# Patient Record
Sex: Female | Born: 1950 | Race: Black or African American | Hispanic: No | Smoking: Never smoker
Health system: Southern US, Community
[De-identification: ages and names within clinical notes are randomized; demographics above are authoritative.]

## PROBLEM LIST (undated history)

## (undated) DIAGNOSIS — I77810 Thoracic aortic ectasia: Secondary | ICD-10-CM

## (undated) DIAGNOSIS — I10 Essential (primary) hypertension: Secondary | ICD-10-CM

## (undated) DIAGNOSIS — F329 Major depressive disorder, single episode, unspecified: Secondary | ICD-10-CM

## (undated) DIAGNOSIS — R51 Headache: Secondary | ICD-10-CM

## (undated) DIAGNOSIS — I519 Heart disease, unspecified: Secondary | ICD-10-CM

## (undated) DIAGNOSIS — F32A Depression, unspecified: Secondary | ICD-10-CM

## (undated) DIAGNOSIS — R413 Other amnesia: Secondary | ICD-10-CM

## (undated) HISTORY — DX: Thoracic aortic ectasia: I77.810

## (undated) HISTORY — DX: Depression, unspecified: F32.A

## (undated) HISTORY — DX: Other amnesia: R41.3

## (undated) HISTORY — DX: Major depressive disorder, single episode, unspecified: F32.9

## (undated) HISTORY — DX: Essential (primary) hypertension: I10

## (undated) HISTORY — DX: Headache: R51

## (undated) HISTORY — DX: Heart disease, unspecified: I51.9

---

## 2002-06-18 HISTORY — PX: GALLBLADDER SURGERY: SHX652

## 2012-09-03 ENCOUNTER — Other Ambulatory Visit (HOSPITAL_BASED_OUTPATIENT_CLINIC_OR_DEPARTMENT_OTHER): Payer: Self-pay | Admitting: *Deleted

## 2012-09-03 DIAGNOSIS — Z1231 Encounter for screening mammogram for malignant neoplasm of breast: Secondary | ICD-10-CM

## 2012-09-05 ENCOUNTER — Ambulatory Visit (HOSPITAL_BASED_OUTPATIENT_CLINIC_OR_DEPARTMENT_OTHER)
Admission: RE | Admit: 2012-09-05 | Discharge: 2012-09-05 | Disposition: A | Discharge status: Home / Self Care | Payer: BC Managed Care – PPO | Source: Ambulatory Visit | Attending: Diagnostic Neuroimaging | Admitting: Diagnostic Neuroimaging

## 2012-09-05 DIAGNOSIS — Z1231 Encounter for screening mammogram for malignant neoplasm of breast: Secondary | ICD-10-CM

## 2012-09-22 ENCOUNTER — Encounter: Payer: Self-pay | Admitting: Diagnostic Neuroimaging

## 2012-09-22 ENCOUNTER — Ambulatory Visit (INDEPENDENT_AMBULATORY_CARE_PROVIDER_SITE_OTHER): Payer: BC Managed Care – PPO | Admitting: Diagnostic Neuroimaging

## 2012-09-22 VITALS — BP 146/87 | HR 77 | Ht 60.0 in | Wt 206.0 lb

## 2012-09-22 DIAGNOSIS — R413 Other amnesia: Secondary | ICD-10-CM | POA: Insufficient documentation

## 2012-09-22 DIAGNOSIS — F419 Anxiety disorder, unspecified: Secondary | ICD-10-CM

## 2012-09-22 DIAGNOSIS — F411 Generalized anxiety disorder: Secondary | ICD-10-CM

## 2012-09-22 DIAGNOSIS — F329 Major depressive disorder, single episode, unspecified: Secondary | ICD-10-CM

## 2012-09-22 DIAGNOSIS — F32A Depression, unspecified: Secondary | ICD-10-CM

## 2012-09-22 NOTE — Patient Instructions (Signed)
I will check additional testing and call you with the results.

## 2012-09-22 NOTE — Progress Notes (Signed)
[facility]  PATIENT: Kara Thornton DOB: 22-Oct-1950  REFERRING CLINICIAN: [facility] HISTORY FROM: patient and daughter REASON FOR VISIT: new consult   HISTORICAL  CHIEF COMPLAINT:  Chief Complaint  Patient presents with  . Memory Loss  . Pain    head, L leg, both arms    HISTORY OF PRESENT ILLNESS:   62 year old female with hypertension, here for evaluation of memory loss.  Since August 2013, patient has had progressive short-term memory problems. 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 In October 2013, per schedule was changed. Patient had difficult time compensating for her change in routine. She was needing more assistance from other teachers. Around the same time she was having more stress overall. She was having 2 right more notes, having more problems with short-term memory loss, forgetting where she placed objects. Over the past 3 weeks, she and her daughter decided we better for patient to live with daughter here in [place]. Patient is also been getting other medical evaluation and testing.  Patient has a diffuse constellation of symptoms including migraine headaches, neck pain, pain in her elbows, pain in her lower leg. She has some numbness and tingling as well.  Patient also having intermittent tearing of her eyes. Patient not able to comment witnesses crying related to depression and emotions or uncontrolled lacrimation from other cause.  REVIEW OF SYSTEMS: Full 14 system review of systems performed and notable only for fatigue, chest pain, palpitations murmur rash itching shortness of breath, flushing joint pain skin sensitivity memory loss confusion headache sleepiness snoring restless legs anxiety decreased energy change in appetite.  ALLERGIES: No Known Allergies  HOME MEDICATIONS: No outpatient prescriptions prior to visit.   No facility-administered medications prior to visit.     PAST MEDICAL HISTORY: Past Medical History  Diagnosis Date  . Hypertension   . Headache   . Heart disease, unspecified     PAST SURGICAL HISTORY: Past Surgical History  Procedure Laterality Date  . Gallbladder surgery  2004    FAMILY HISTORY: Family History  Problem Relation Age of Onset  . Alzheimer's disease Mother   . Other Father     infection after heart surgery  . Other      blood clots  . Aneurysm    . Other      valve problems    SOCIAL HISTORY:  History   Social History  . Marital Status: Legally Separated    Spouse Name: N/A    Number of Children: 3  . 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 Smoking status: Never Smoker   . Smokeless tobacco: Not on file  . Alcohol Use: No  . Drug Use: No  . Sexually Active: Not on file   Other Topics Concern  . Not on file   Social History Narrative   Pt lives at home alone.    Caffeine Use-  Quit drinking 3-4 cups daily on 08/25/12.     PHYSICAL EXAM  Filed Vitals:   09/22/12 1159  BP: 146/87  Pulse: 77  Height: 5' (1.524 m)  Weight: 206 lb (93.441 kg)   Body mass index is 40.23 kg/(m^2).  GENERAL EXAM: Patient is in no distress  CARDIOVASCULAR: Regular rate and rhythm, no murmurs, no carotid bruits  NEUROLOGIC: MENTAL STATUS: awake, alert, language fluent, comprehension intact, naming intact; MMSE  26/30. GDS 6. NO FRONTAL RELEASE SIGNS. TEARFUL. CRANIAL NERVE: no papilledema on fundoscopic exam, pupils equal and reactive to light, visual fields full to confrontation, extraocular muscles intact, no nystagmus, facial sensation and strength symmetric, uvula midline, shoulder shrug symmetric, tongue midline. MOTOR: normal bulk and tone, full strength in the BUE, BLE SENSORY: normal and symmetric to light touch, pinprick, temperature, vibration COORDINATION: finger-nose-finger, fine finger movements normal REFLEXES:  deep tendon reflexes present and symmetric GAIT/STATION: narrow based gait; UNABLE TO TANDEM. Romberg is negative   DIAGNOSTIC DATA (LABS, IMAGING, TESTING) - I reviewed patient records, labs, notes, testing and imaging myself where available.  No results found for this basename: WBC, HGB, HCT, MCV, PLT   No results found for this basename: na, k, cl, co2, glucose, bun, creatinine, calcium, prot, albumin, ast, alt, alkphos, bilitot, gfrnonaa, gfraa   No results found for this basename: CHOL, HDL, LDLCALC, LDLDIRECT, TRIG, CHOLHDL   No results found for this basename: HGBA1C   No results found for this basename: VITAMINB12   No results found for this basename: TSH     ASSESSMENT AND PLAN  62 y.o. year old female  has a past medical history of Hypertension; Headache; and Heart disease, unspecified. here with progressive short-term memory loss. Confounding factors include concomitant depression, anxiety symptoms, stress related to change in her work situation, diffuse pain in her neck, elbow and left leg.  Differential diagnosis includes mild dementia, metabolic, stress, conversion reaction.  I will check MRI of the brain, lab testing and also have more detailed neuropsychological evaluation. It is probably not a good idea for patient to work at this time until her symptoms improve.   Orders Placed This Encounter  Procedures  . MR Brain Wo Contrast  . TSH  . Vitamin B12  . Ambulatory referral to Neuropsychology    Suanne Marker, MD 09/22/2012, 12:29 PM Certified in Neurology, Neurophysiology and Neuroimaging  [facility] [address], [address] [place], [place] [address] [phone]

## 2012-10-03 ENCOUNTER — Telehealth: Payer: Self-pay

## 2012-10-03 NOTE — Telephone Encounter (Signed)
Pt called in error. Was trying to return call to [facility] to confirm MRI appt.

## 2012-10-17 ENCOUNTER — Telehealth: Payer: Self-pay

## 2012-10-17 NOTE — Telephone Encounter (Signed)
Agree. Please write letter. -VRP

## 2012-10-17 NOTE — Telephone Encounter (Signed)
Spoke to daughter. Says pt's symptoms have increased. More confusion and pain. Was told sleep apnea results were high. 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 Neuropsych. testing and MRI are both scheduled for 10/23/12. Pt has been approved for disability but needs original work letter written by our office extended to end of school year (12/15/12).

## 2012-10-20 ENCOUNTER — Telehealth: Payer: Self-pay | Admitting: Nurse Practitioner

## 2012-10-20 NOTE — Telephone Encounter (Signed)
I called and left a message for the patient that Dr. Marjory Lies has to sign the letter and once sign, she will receive a call.

## 2012-10-23 ENCOUNTER — Ambulatory Visit (INDEPENDENT_AMBULATORY_CARE_PROVIDER_SITE_OTHER): Payer: BC Managed Care – PPO

## 2012-10-23 DIAGNOSIS — R413 Other amnesia: Secondary | ICD-10-CM

## 2012-10-29 DIAGNOSIS — Z0289 Encounter for other administrative examinations: Secondary | ICD-10-CM

## 2012-10-30 DIAGNOSIS — R413 Other amnesia: Secondary | ICD-10-CM

## 2012-11-17 ENCOUNTER — Telehealth: Payer: Self-pay | Admitting: Diagnostic Neuroimaging

## 2012-11-17 NOTE — Telephone Encounter (Signed)
Patient is calling to inquire about her disability paper work.  This was due on May 24 of this year.  Please give her a call back at:  [phone].

## 2012-11-19 ENCOUNTER — Telehealth: Payer: Self-pay | Admitting: Diagnostic Neuroimaging

## 2012-11-20 NOTE — Telephone Encounter (Signed)
I called and spoke with patient's daughter and she stated her mom will be seeing her pcp on 12-03-12 and she will see what her pcp prescribe for her headache and will call back to see if Dr. Marjory Lies agrees with the medication.

## 2012-11-21 ENCOUNTER — Telehealth: Payer: Self-pay | Admitting: Diagnostic Neuroimaging

## 2012-11-21 NOTE — Telephone Encounter (Signed)
I called and spoke with the patient that she dropped off her paper on May 4 and there's a two week turn around for forms and they're ready she will receive  a call.

## 2012-11-21 NOTE — Telephone Encounter (Signed)
Patient is calling to see if her disability paper work has been completed.  She would like to have a call back asap.

## 2012-11-27 DIAGNOSIS — Z0289 Encounter for other administrative examinations: Secondary | ICD-10-CM

## 2012-11-28 ENCOUNTER — Telehealth: Payer: Self-pay | Admitting: Diagnostic Neuroimaging

## 2012-12-02 NOTE — Telephone Encounter (Signed)
I called patient's daughter back and left a voice message that I was returning her call.  Also, I am unsure what she needs regarding a doctor's note. Please call back.

## 2013-01-12 DIAGNOSIS — Z0289 Encounter for other administrative examinations: Secondary | ICD-10-CM

## 2013-01-23 ENCOUNTER — Telehealth: Payer: Self-pay

## 2013-01-23 NOTE — Telephone Encounter (Signed)
I called and left a VM for patient that we have to see her before we can complete medical forms. Please call and schedule an appointment with Heide Guile, NP who works with Dr. Marjory Lies so we can see you quite readily.

## 2013-02-25 ENCOUNTER — Telehealth: Payer: Self-pay | Admitting: Nurse Practitioner

## 2013-02-25 NOTE — Telephone Encounter (Signed)
Call pt about her appt. And the pt acknowledge that she was coming. °

## 2013-02-26 ENCOUNTER — Ambulatory Visit (INDEPENDENT_AMBULATORY_CARE_PROVIDER_SITE_OTHER): Payer: BC Managed Care – PPO | Admitting: Nurse Practitioner

## 2013-02-26 ENCOUNTER — Encounter: Payer: Self-pay | Admitting: Nurse Practitioner

## 2013-02-26 ENCOUNTER — Encounter: Payer: Self-pay | Admitting: *Deleted

## 2013-02-26 ENCOUNTER — Telehealth: Payer: Self-pay | Admitting: Diagnostic Neuroimaging

## 2013-02-26 VITALS — BP 139/71 | HR 60 | Temp 98.8°F | Ht 60.0 in | Wt 221.0 lb

## 2013-02-26 DIAGNOSIS — F3289 Other specified depressive episodes: Secondary | ICD-10-CM

## 2013-02-26 DIAGNOSIS — F419 Anxiety disorder, unspecified: Secondary | ICD-10-CM

## 2013-02-26 DIAGNOSIS — F411 Generalized anxiety disorder: Secondary | ICD-10-CM

## 2013-02-26 DIAGNOSIS — R202 Paresthesia of skin: Secondary | ICD-10-CM

## 2013-02-26 DIAGNOSIS — F32A Depression, unspecified: Secondary | ICD-10-CM

## 2013-02-26 DIAGNOSIS — R413 Other amnesia: Secondary | ICD-10-CM

## 2013-02-26 DIAGNOSIS — F329 Major depressive disorder, single episode, unspecified: Secondary | ICD-10-CM

## 2013-02-26 DIAGNOSIS — R209 Unspecified disturbances of skin sensation: Secondary | ICD-10-CM

## 2013-02-26 NOTE — Progress Notes (Signed)
I reviewed note and agree with plan.   Suanne Marker, MD 02/26/2013, 1:23 PM Certified in Neurology, Neurophysiology and Neuroimaging  [facility] [address] [place], [place] [address] [phone]

## 2013-02-26 NOTE — Patient Instructions (Addendum)
We will re-check Vitamin B12 levels today and let you know early in next week of the results.  Your memory testing is considered normal for age, and teting is better today than last visit.  Anxiety and depression are likely contributing to the memory problems.  Continue the medication that your family dr. Has prescribed and get referral to Psychiatry if needed.  Follow up here in 3 months.

## 2013-02-26 NOTE — Progress Notes (Signed)
[facility]  PATIENT: Kara Thornton DOB: 1951-05-14   REASON FOR VISIT: follow up HISTORY FROM: Granddaugter, patient  HISTORY OF PRESENT ILLNESS: 09/22/12 PRIOR HPI (VRP):  62 year old female with hypertension, here for evaluation of memory loss.  Since August 2013, patient has had progressive short-term memory problems. 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 Patient had difficult time compensating for her change in routine. She was needing more assistance from other teachers. Around the same time she was having more stress overall. She was having 2 right more notes, having more problems with short-term memory loss, forgetting where she placed objects. Over the past 3 weeks, she and her daughter decided it would be better for patient to live with daughter here in [place]. Patient is also been getting other medical evaluation and testing.   Patient has a diffuse constellation of symptoms including migraine headaches, neck pain, pain in her elbows, pain in her lower leg. She has some numbness and tingling as well.  Patient also having intermittent tearing of her eyes. Patient not able to comment witnesses crying related to depression and emotions or uncontrolled lacrimation from other cause.   UPDATE 02/26/13 (LL): Patient returns for followup after initial visit on 09/22/2012.  Since that visit testing showed borderline low vitamin B12 level, she was started on by mouth supplementation; MRI brain showing nonspecific white matter changes likely small vessel ischemic disease, normal TSH, and neuropsychological testing which indicated mild impairments of attention, cognitive flexibility and organizational skills. Memory retention, naming to confrontation, verbal fluency, visual spatial organization, constructional praxis and conceptual reasoning skills were within normal limits. On the Beck  depression inventory and anxiety inventories her scores both fell in the severe range for depression and anxiety. She was started by family medicine on an SSRI and then switched to Prozac 20 mg daily.  Granddaughter notes that her pitch take his symptoms have improved, but cognitive function has not.  She continues to have many somatic pain complaints.  REVIEW OF SYSTEMS: Full 14 system review of systems performed and notable only for fatigue, chest pain, palpitations, cough, snoring, feeling hot, flushing, joint pain, memory loss confusion headache, numbness, weakness, sleepiness snoring restless legs, depression, anxiety, decreased energy, change in appetite.   ALLERGIES: No Known Allergies  HOME MEDICATIONS: Outpatient Prescriptions Prior to Visit  Medication Sig Dispense Refill  . lisinopril-hydrochlorothiazide (PRINZIDE,ZESTORETIC) 20-25 MG per tablet Take 1 tablet by mouth daily.       Medications  . verapamil (COVERA HS) 240 MG (CO) 24 hr tablet    Sig: Take 240 mg by mouth at bedtime.  . metoprolol succinate (TOPROL-XL) 50 MG 24 hr tablet    Sig: Take 50 mg by mouth daily. Take with or immediately following a meal.  . vitamin B-12 (CYANOCOBALAMIN) 1000 MCG tablet    Sig: Take 1,000 mcg by mouth daily.  . Cholecalciferol (D3-1000) 1000 UNITS capsule    Sig: Take 1,000 Units by mouth daily.  Marland Kitchen FLUoxetine (PROZAC) 20 MG capsule    Sig: Take 20 mg by mouth daily.  . ranitidine (ZANTAC) 150 MG tablet    Sig: Take 150 mg by mouth 2 (two) times daily.  . celecoxib (CELEBREX) 200 MG capsule    Sig: Take 200 mg by mouth as needed for pain.    PAST MEDICAL HISTORY: Past Medical History  Diagnosis Date  . Hypertension   . Headache(784.0)   .  Heart disease, unspecified     PAST SURGICAL HISTORY: Past Surgical History  Procedure Laterality Date  . Gallbladder surgery  2004    FAMILY HISTORY: Family History  Problem Relation Age of Onset  . Alzheimer's disease Mother   .  Other Father     infection after heart surgery  . Other      blood clots  . Aneurysm    . Other      valve problems    SOCIAL HISTORY: History   Social History  . Marital Status: Legally Separated    Spouse Name: N/A    Number of Children: 3  . 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 Smoking status: Never Smoker   . Smokeless tobacco: Not on file  . Alcohol Use: No  . Drug Use: No  . Sexual Activity: Not on file   Other Topics Concern  . Not on file   Social History Narrative   Pt lives at home alone.    Caffeine Use-  Quit drinking 3-4 cups daily on 08/25/12.     PHYSICAL EXAM  Filed Vitals:   02/26/13 0913  BP: 139/71  Pulse: 60  Temp: 98.8 F (37.1 C)  TempSrc: Oral  Height: 5' (1.524 m)  Weight: 221 lb (100.245 kg)   Body mass index is 43.16 kg/(m^2).  Generalized: Well developed, in no acute distress, obese female Head: normocephalic and atraumatic. Oropharynx benign  Neck: Supple, no carotid bruits  Cardiac: Regular rate rhythm, no murmur  Musculoskeletal: No deformity   NEUROLOGIC:  MENTAL STATUS: awake, alert, language fluent, comprehension intact, naming intact; MMSE 29/30 WITH DEFICIT IN RECALL, 2/3, AFT 10, CLOCK DRAWING 2/4, GDS 9 SUGGESTS DEPRESSION. (Last visit on 09/22/12: MMSE 26/30. GDS 6). NO FRONTAL RELEASE SIGNS. TEARFUL, ANXIOUS.  CRANIAL NERVE: pupils equal and reactive to light, visual fields full to confrontation, extraocular muscles intact, no nystagmus, facial sensation and strength symmetric, uvula midline, shoulder shrug symmetric, tongue midline.  MOTOR: normal bulk and tone, full strength in the BUE, BLE  SENSORY: normal and symmetric to light touch, pinprick  COORDINATION: finger-nose-finger, fine finger movements normal  REFLEXES: deep tendon reflexes present and symmetric  GAIT/STATION: narrow based gait; UNABLE TO TANDEM. Romberg is  negative  DIAGNOSTIC DATA (LABS, IMAGING, TESTING) - I reviewed patient records, labs, notes, testing and imaging myself where available.  Lab Results  Component Value Date   VITAMINB12 224 09/23/2012   Lab Results  Component Value Date   TSH 1.620 09/23/2012   10/23/12 MRI of the brain without contrast  Equivocal MRI brain (without) demonstrating scattered non-specific foci of gliosis in the subcortical and periventricular white matter, likely mild chronic small vessel ischemic disease.  ASSESSMENT AND PLAN 62 y.o. year old female has a past medical history of Hypertension; Headache; and Heart disease, here with progressive short-term memory loss. Confounding factors include concomitant depression, anxiety symptoms, stress related to change in her work situation, diffuse pain in her neck, elbow and bilateral hips.  Patient was found to have small vessel disease on MRI brain, which could represent a mild vascular dementia.  Consistent high scores on depression and anxiety inventories along with many somatic complaints point to under-treated psychological etiology.    PLAN: 1. Vitamin B12 level was borderline low and oral supplementation was started after last visit, recheck level today, may need to change to injections. 2. Continue follow  up with PCP for depression and anxiety; referral to Psychiatry is advised. 3. It is not advised for patient to work at this time until her symptoms improve, letter given for 90 days, then re-evaluate at next follow up visit.  Orders Placed This Encounter  Procedures  . Vitamin B12   LYNN E. LAM, MSN, NP-C 02/26/2013, 11:33 AM [facility] [address] [place], [place] [address] [phone]

## 2013-02-26 NOTE — Telephone Encounter (Signed)
I called patient's daughter back and left her a VM that I got her message that her mom had an office visit. I will return her forms back to the stack to be worked on. Anticipate two weeks turn around.

## 2013-02-27 ENCOUNTER — Ambulatory Visit: Payer: BC Managed Care – PPO | Admitting: Nurse Practitioner

## 2013-02-27 ENCOUNTER — Telehealth: Payer: Self-pay | Admitting: Diagnostic Neuroimaging

## 2013-02-27 LAB — VITAMIN B12: Vitamin B-12: 467 pg/mL (ref 211–946)

## 2013-02-27 NOTE — Telephone Encounter (Signed)
I called and spoke to daughter, Lawson Fiscal, and relayed the letter contents.  Change from the original, to make the time back for re eval on 04-21-13 0830 with Dr. Marjory Lies.  She relayed this was ok.  Fax to [phone].   DONE.

## 2013-03-02 ENCOUNTER — Telehealth: Payer: Self-pay | Admitting: *Deleted

## 2013-03-02 NOTE — Telephone Encounter (Signed)
Please call daughter Jacki Cones. She wants the results for her moms B-12 labs. She can be reached at [phone]

## 2013-03-03 NOTE — Progress Notes (Signed)
Quick Note:  I called and relayed message below re: B12 level. Both she and Lawson Fiscal, her daughter verbalized understanding. ______

## 2013-03-13 ENCOUNTER — Telehealth: Payer: Self-pay

## 2013-03-16 ENCOUNTER — Telehealth: Payer: Self-pay

## 2013-03-16 NOTE — Telephone Encounter (Signed)
Forms with MD for signature

## 2013-03-24 ENCOUNTER — Telehealth: Payer: Self-pay

## 2013-03-24 NOTE — Telephone Encounter (Signed)
Called patient to update on status of disability forms. Phone numbers have been disconnected.

## 2013-03-24 NOTE — Telephone Encounter (Signed)
I attempted to call patient to update on status of disability forms. Both phone numbers have been disconnected.

## 2013-03-30 ENCOUNTER — Telehealth: Payer: Self-pay | Admitting: Diagnostic Neuroimaging

## 2013-03-30 NOTE — Telephone Encounter (Signed)
I called patient's daughter and left a VM that she will be able to come pick up forms after noon on March 31 2013.

## 2013-03-31 ENCOUNTER — Telehealth: Payer: Self-pay | Admitting: Diagnostic Neuroimaging

## 2013-04-20 ENCOUNTER — Ambulatory Visit: Payer: BC Managed Care – PPO | Admitting: Diagnostic Neuroimaging

## 2013-04-21 ENCOUNTER — Encounter: Payer: Self-pay | Admitting: Diagnostic Neuroimaging

## 2013-04-21 ENCOUNTER — Ambulatory Visit (INDEPENDENT_AMBULATORY_CARE_PROVIDER_SITE_OTHER): Payer: BC Managed Care – PPO | Admitting: Diagnostic Neuroimaging

## 2013-04-21 ENCOUNTER — Ambulatory Visit: Payer: BC Managed Care – PPO | Admitting: Diagnostic Neuroimaging

## 2013-04-21 VITALS — BP 131/73 | HR 54 | Ht 60.0 in | Wt 216.0 lb

## 2013-04-21 DIAGNOSIS — H5500 Unspecified nystagmus: Secondary | ICD-10-CM

## 2013-04-21 DIAGNOSIS — F329 Major depressive disorder, single episode, unspecified: Secondary | ICD-10-CM

## 2013-04-21 DIAGNOSIS — R413 Other amnesia: Secondary | ICD-10-CM

## 2013-04-21 DIAGNOSIS — F32A Depression, unspecified: Secondary | ICD-10-CM

## 2013-04-21 DIAGNOSIS — M25551 Pain in right hip: Secondary | ICD-10-CM

## 2013-04-21 DIAGNOSIS — M25559 Pain in unspecified hip: Secondary | ICD-10-CM

## 2013-04-21 MED ORDER — DONEPEZIL HCL 5 MG PO TABS: 5.0000 mg | ORAL_TABLET | Freq: Every day | ORAL | Status: DC

## 2013-04-21 MED ORDER — DONEPEZIL HCL 10 MG PO TABS: 10.0000 mg | ORAL_TABLET | Freq: Every day | ORAL | Status: DC

## 2013-04-21 NOTE — Progress Notes (Signed)
[facility]  PATIENT: Kara Thornton DOB: May 24, 1951   REASON FOR VISIT: follow up HISTORY FROM: Granddaughter, patient  HISTORY OF PRESENT ILLNESS:  UPDATE 04/21/13: Since last visit, depression better, but memory still poor. Patient having trouble with recent conversations, tasks, getting confused easily. Also with diffuse joint pain, mainly in right hip, s/p cortisone injection.  UPDATE 02/26/13 (LL): Patient returns for followup after initial visit on 09/22/2012.  Since that visit testing showed borderline low vitamin B12 level, she was started on by mouth supplementation; MRI brain showing nonspecific white matter changes likely small vessel ischemic disease, normal TSH, and neuropsychological testing which indicated mild impairments of attention, cognitive flexibility and organizational skills. Memory retention, naming to confrontation, verbal fluency, visual spatial organization, constructional praxis and conceptual reasoning skills were within normal limits. On the Beck depression inventory and anxiety inventories her scores both fell in the severe range for depression and anxiety. She was started by family medicine on an SSRI and then switched to Prozac 20 mg daily.  Granddaughter notes that her pitch take his symptoms have improved, but cognitive function has not.  She continues to have many somatic pain complaints.  PRIOR HPI (09/22/12, VRP):  62 year old female with hypertension, here for evaluation of memory loss.  Since August 2013, patient has had progressive short-term memory problems. 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 Patient had difficult time compensating for her change in routine. She was needing more assistance from other teachers. Around the same time she was having more stress overall. She was having 2 right more notes, having more problems with short-term memory loss,  forgetting where she placed objects. Over the past 3 weeks, she and her daughter decided it would be better for patient to live with daughter here in [place]. Patient is also been getting other medical evaluation and testing.   Patient has a diffuse constellation of symptoms including migraine headaches, neck pain, pain in her elbows, pain in her lower leg. She has some numbness and tingling as well. Patient also having intermittent tearing of her eyes. Patient not able to comment whether crying related to depression and emotions or uncontrolled lacrimation from other cause.     REVIEW OF SYSTEMS: Full 14 system review of systems performed and notable only for fatigue chest pain palpitation murmur hearing loss eye pain shortness of breath cough snoring feeling hot increased thirst flushing joint pain joint swelling runny nose depression anxiety too much sleep decreased energy to change in appetite dizziness numbness headache confusion sleepiness memory loss restless legs.   ALLERGIES: No Known Allergies  HOME MEDICATIONS: Outpatient Prescriptions Prior to Visit  Medication Sig Dispense Refill  . celecoxib (CELEBREX) 200 MG capsule Take 200 mg by mouth as needed for pain.      . Cholecalciferol (D3-1000) 1000 UNITS capsule Take 1,000 Units by mouth daily.      Marland Kitchen lisinopril-hydrochlorothiazide (PRINZIDE,ZESTORETIC) 20-25 MG per tablet Take 1 tablet by mouth daily.      . metoprolol succinate (TOPROL-XL) 50 MG 24 hr tablet Take 50 mg by mouth daily. Take with or immediately following a meal.      . ranitidine (ZANTAC) 150 MG tablet Take 150 mg by mouth 2 (two) times daily.      . verapamil (COVERA HS) 240 MG (CO) 24 hr tablet Take 240 mg by mouth at bedtime.      . vitamin B-12 (CYANOCOBALAMIN) 1000  MCG tablet Take 1,000 mcg by mouth daily.      Marland Kitchen FLUoxetine (PROZAC) 20 MG capsule Take 20 mg by mouth daily.       No facility-administered medications prior to visit.     PAST MEDICAL  HISTORY: Past Medical History  Diagnosis Date  . Hypertension   . Headache(784.0)   . Heart disease, unspecified     PAST SURGICAL HISTORY: Past Surgical History  Procedure Laterality Date  . Gallbladder surgery  2004    FAMILY HISTORY: Family History  Problem Relation Age of Onset  . Alzheimer's disease Mother   . Other Father     infection after heart surgery  . Other      blood clots  . Aneurysm    . Other      valve problems    SOCIAL HISTORY: History   Social History  . Marital Status: Legally Separated    Spouse Name: N/A    Number of Children: 3  . Years of Education: MA   Occupational History  . Teacher     [place]  .     Social History Main Topics  . Smoking status: Never Smoker   . Smokeless tobacco: Never Used  . Alcohol Use: No  . Drug Use: No  . Sexual Activity: Not on file   Other Topics Concern  . Not on file   Social History Narrative   Pt lives at home alone.    Caffeine Use-  Quit drinking 3-4 cups daily on 08/25/12.     PHYSICAL EXAM  Filed Vitals:   04/21/13 0829  BP: 131/73  Pulse: 54  Height: 5' (1.524 m)  Weight: 216 lb (97.977 kg)   Body mass index is 42.18 kg/(m^2).  GENERAL EXAM: Patient is in no distress  CARDIOVASCULAR: Regular rate and rhythm, no murmurs, no carotid bruits  NEUROLOGIC: MENTAL STATUS: awake, alert, language fluent, comprehension intact, naming intact; MMSE 23/30. NO FRONTAL RELEASE SIGNS. AFT 11. GDS 7. SLOW RESPONSE. CRANIAL NERVE: no papilledema on fundoscopic exam, pupils equal and reactive to light, visual fields full to confrontation, extraocular muscles NOTABLE FOR EXOTROPIA, WITH SUSTAINED NYSTAGMUS IN ALL DIRECTIONS (WORSE IN LEFT GAZE), facial sensation and strength symmetric, uvula midline, shoulder shrug symmetric, tongue midline. MOTOR: normal bulk and tone, full strength in the BUE, BLE SENSORY: normal and symmetric to light touch, pinprick, temperature,  vibration COORDINATION: finger-nose-finger, fine finger movements, heel-shin normal REFLEXES: deep tendon reflexes present and symmetric GAIT/STATION: narrow based gait; ANTALGIC, SLOW, UNSTEADY. CANNOT TANDEM.    DIAGNOSTIC DATA (LABS, IMAGING, TESTING) - I reviewed patient records, labs, notes, testing and imaging myself where available.  Lab Results  Component Value Date   VITAMINB12 467 02/26/2013   Lab Results  Component Value Date   TSH 1.620 09/23/2012   10/23/12  MRI brain (without) - scattered non-specific foci of gliosis in the subcortical and periventricular white matter, likely mild chronic small vessel ischemic disease.  ASSESSMENT AND PLAN 62 y.o. female with Hypertension; Headache; and Heart disease, here with progressive short-term memory loss (MMSE 26 --> 29 --> 23), depression and chronic pain.  Ddx: pseudo-dementia of depression, mild dementia, pain associated cognitive decline  PLAN: 1. MRI brain (new onset nystagmus) 2. Start donepezil 3. PT and NSAIDS for pain   Orders Placed This Encounter  Procedures  . MR Brain W Wo Contrast    Suanne Marker, MD 04/21/2013, 9:42 AM Certified in Neurology, Neurophysiology and Neuroimaging  [facility] [phone]  [address], [place], [place] [address] [phone]

## 2013-04-21 NOTE — Patient Instructions (Signed)
I will check MRI brain.  I will start donepezil 5mg  at bedtime. After 1 month, increase to 10mg  at bedtime.  Try physical therapy and water therapy for pain.

## 2013-05-20 ENCOUNTER — Encounter: Payer: Self-pay | Admitting: Nurse Practitioner

## 2013-05-21 DIAGNOSIS — Z0289 Encounter for other administrative examinations: Secondary | ICD-10-CM

## 2013-06-03 ENCOUNTER — Ambulatory Visit (INDEPENDENT_AMBULATORY_CARE_PROVIDER_SITE_OTHER): Payer: BC Managed Care – PPO

## 2013-06-03 DIAGNOSIS — R413 Other amnesia: Secondary | ICD-10-CM

## 2013-06-03 DIAGNOSIS — H5500 Unspecified nystagmus: Secondary | ICD-10-CM

## 2013-06-03 MED ORDER — GADOPENTETATE DIMEGLUMINE 469.01 MG/ML IV SOLN: 20.0000 mL | Freq: Once | INTRAVENOUS | Status: AC | PRN

## 2013-08-19 ENCOUNTER — Ambulatory Visit (INDEPENDENT_AMBULATORY_CARE_PROVIDER_SITE_OTHER): Payer: BC Managed Care – PPO | Admitting: Nurse Practitioner

## 2013-08-19 ENCOUNTER — Encounter: Payer: Self-pay | Admitting: Nurse Practitioner

## 2013-08-19 VITALS — BP 127/76 | HR 75 | Wt 226.0 lb

## 2013-08-19 DIAGNOSIS — H5500 Unspecified nystagmus: Secondary | ICD-10-CM

## 2013-08-19 DIAGNOSIS — F3289 Other specified depressive episodes: Secondary | ICD-10-CM

## 2013-08-19 DIAGNOSIS — F32A Depression, unspecified: Secondary | ICD-10-CM

## 2013-08-19 DIAGNOSIS — F329 Major depressive disorder, single episode, unspecified: Secondary | ICD-10-CM

## 2013-08-19 DIAGNOSIS — R413 Other amnesia: Secondary | ICD-10-CM

## 2013-08-19 MED ORDER — MEMANTINE HCL ER 28 MG PO CP24: 1.0000 | ORAL_CAPSULE | Freq: Every morning | ORAL | Status: DC

## 2013-08-19 NOTE — Progress Notes (Signed)
PATIENT: Kara Thornton DOB: 03/02/1951   REASON FOR VISIT: follow up for memory loss HISTORY FROM: patient  HISTORY OF PRESENT ILLNESS: UPDATE 08/19/13 (LL):  Since last visit, has been stable, seeing Psychiatrist and depression seems better. Still easily confused, short term memory very poor.  Pain is much improved since cortisone shot.  Diffuse pain improved.  Repeat MRI brain W/Wo unchanged from previous.  Functional Assessment:  Activities of Daily Living (ADLs):   She is independent in the following: ambulation, bathing and hygiene, feeding, continence, grooming, toileting and dressing Requires assistance with the following: none Instrumental Activities of Daily Living (IADLs):   Patient is independent in the following: operating telephone, shops for herself, housekeeping, laundry, drives own car. Requires assistance with the following: Medication administration, finances, meal preparation  UPDATE 04/21/13(VP): Since last visit, depression better, but memory still poor. Patient having trouble with recent conversations, tasks, getting confused easily. Also with diffuse joint pain, mainly in right hip, s/p cortisone injection.  UPDATE 02/26/13 (LL): Patient returns for followup after initial visit on 09/22/2012. Since that visit testing showed borderline low vitamin B12 level, she was started on by mouth supplementation; MRI brain showing nonspecific white matter changes likely small vessel ischemic disease, normal TSH, and neuropsychological testing which indicated mild impairments of attention, cognitive flexibility and organizational skills. Memory retention, naming to confrontation, verbal fluency, visual spatial organization, constructional praxis and conceptual reasoning skills were within normal limits. On the Beck depression inventory and anxiety inventories her scores both fell in the severe range for depression and anxiety. She was started by family medicine on an SSRI and then  switched to Prozac 20 mg daily. Granddaughter notes that her pitch take his symptoms have improved, but cognitive function has not. She continues to have many somatic pain complaints.  PRIOR HPI (09/22/12, VRP): 63 year old female with hypertension, here for evaluation of memory loss.  Since August 2013, patient has had progressive short-term memory problems. 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 Patient had difficult time compensating for her change in routine. She was needing more assistance from other teachers. Around the same time she was having more stress overall. She was having 2 right more notes, having more problems with short-term memory loss, forgetting where she placed objects. Over the past 3 weeks, she and her daughter decided it would be better for patient to live with daughter here in [place]. Patient is also been getting other medical evaluation and testing.  Patient has a diffuse constellation of symptoms including migraine headaches, neck pain, pain in her elbows, pain in her lower leg. She has some numbness and tingling as well. Patient also having intermittent tearing of her eyes. Patient not able to comment whether crying related to depression and emotions or uncontrolled lacrimation from other cause.   REVIEW OF SYSTEMS: Full 14 system review of systems performed and notable only for fatigue hearing loss eye pain shortness of breath cough snoring feeling hot increased thirst flushing joint pain joint swelling runny nose, drooling,  depression anxiety too much sleep decreased energy, agitation, dizziness numbness headache confusion sleepiness memory loss restless legs.   ALLERGIES: No Known Allergies  HOME MEDICATIONS: Outpatient Prescriptions Prior to Visit  Medication Sig Dispense Refill  . celecoxib (CELEBREX) 200 MG capsule Take 200 mg by mouth as needed for pain.      . Cholecalciferol  (D3-1000) 1000 UNITS capsule Take 1,000 Units by  mouth daily.      Marland Kitchen. donepezil (ARICEPT) 10 MG tablet Take 1 tablet (10 mg total) by mouth at bedtime.  30 tablet  12  . metoprolol succinate (TOPROL-XL) 50 MG 24 hr tablet Take 50 mg by mouth daily. Take with or immediately following a meal.      . ranitidine (ZANTAC) 150 MG tablet Take 150 mg by mouth 2 (two) times daily.      . verapamil (COVERA HS) 240 MG (CO) 24 hr tablet Take 240 mg by mouth at bedtime.      . vitamin B-12 (CYANOCOBALAMIN) 1000 MCG tablet Take 1,000 mcg by mouth daily.      Marland Kitchen. donepezil (ARICEPT) 5 MG tablet Take 1 tablet (5 mg total) by mouth at bedtime.  30 tablet  0  . Venlafaxine HCl 37.5 MG TB24 Take 1 tablet by mouth daily.      Marland Kitchen. lisinopril-hydrochlorothiazide (PRINZIDE,ZESTORETIC) 20-25 MG per tablet Take 1 tablet by mouth daily.      . medroxyPROGESTERone (PROVERA) 2.5 MG tablet Take 2.5 mg by mouth daily.       No facility-administered medications prior to visit.   PHYSICAL EXAM  Filed Vitals:   08/19/13 0846  BP: 127/76  Pulse: 75  Weight: 226 lb (102.513 kg)   Body mass index is 44.14 kg/(m^2).  GENERAL EXAM:  Patient is in no distress  CARDIOVASCULAR:  Regular rate and rhythm, no murmurs, no carotid bruits   Neurological examination  MENTAL STATUS: awake, alert, language fluent, comprehension intact, naming intact; MMSE 2230. NO FRONTAL RELEASE SIGNS. AFT 10 CLOCK DRAWING 4/4. GDS 8. SLOW RESPONSE.  CRANIAL NERVE: no papilledema on fundoscopic exam, pupils equal and reactive to light, visual fields full to confrontation, extraocular muscles NOTABLE FOR EXOTROPIA, WITH SUSTAINED NYSTAGMUS IN ALL DIRECTIONS (WORSE IN LEFT GAZE), facial sensation and strength symmetric, uvula midline, shoulder shrug symmetric, tongue midline.  MOTOR: normal bulk and tone, full strength in the BUE, BLE  SENSORY: normal and symmetric to light touch, pinprick, temperature, vibration  COORDINATION: finger-nose-finger, fine  finger movements, heel-shin normal  REFLEXES: deep tendon reflexes present and symmetric  GAIT/STATION: narrow based gait; ANTALGIC, SLOW, UNSTEADY. CANNOT TANDEM.  DIAGNOSTIC DATA (LABS, IMAGING, TESTING) - I reviewed patient records, labs, notes, testing and imaging myself where available.  Lab Results  Component Value Date   VITAMINB12 467 02/26/2013   Lab Results  Component Value Date   TSH 1.620 09/23/2012   06/03/13 MRI brain W/Wo: Equivocal MRI brain (with and without) demonstrating scattered non-specific foci of gliosis in the subcortical and periventricular white matter, likely mild chronic small vessel ischemic disease. No change from MRI on 10/23/12. Also noted is CSF pulsation artifact in the ventricles.   ASSESSMENT AND PLAN 63 y.o. female with Hypertension; Headache; and Heart disease, here with progressive short-term memory loss, confusion (MMSE 26 --> 29 --> 23 --> 22), depression and chronic pain.   Ddx: pseudo-dementia of depression, mild dementia, pain associated cognitive decline   PLAN:  1. Continue Donepezil and Namenda - change to Namenda XR 28 mg. 2. Continue seeing Psychiatry for depression 3. Work note provided (cannot return to work as Runner, broadcasting/film/videoteacher).   Meds ordered this encounter  Medications  . DULoxetine (CYMBALTA) 60 MG capsule    Sig:   . hydrochlorothiazide (HYDRODIURIL) 25 MG tablet    Sig:   . DISCONTD: NAMENDA 10 MG tablet    Sig:   . Memantine HCl ER (NAMENDA XR) 28 MG CP24  Sig: Take 28 mg by mouth every morning.    Dispense:  30 capsule    Refill:  11    Order Specific Question:  Supervising Provider    Answer:  Suanne Marker [[id number]]   Return in about 6 months (around 02/19/2014).  Ronal Fear, MSN, NP-C 08/19/2013, 10:38 AM [facility] [address] [place], [place] [address] [phone]  Note: This document was prepared with digital dictation and possible smart phrase technology. Any transcriptional errors  that result from this process are unintentional.

## 2013-08-19 NOTE — Progress Notes (Signed)
I reviewed note and agree with plan.   Suanne MarkerVIKRAM R. Jasiya Markie, MD 08/19/2013, 12:35 PM Certified in Neurology, Neurophysiology and Neuroimaging  [facility] [address], [address] [place], [place] [address] [phone]

## 2013-08-19 NOTE — Patient Instructions (Signed)
  1. Continue donepezil and Namenda for memory loss.  I am giving you a prescription for once daily Namenda with a free month coupon.  Finish the [place] twice daily that you have and then start the new XR form. Follow up in 6 months.

## 2013-08-24 ENCOUNTER — Ambulatory Visit: Payer: BC Managed Care – PPO | Admitting: Diagnostic Neuroimaging

## 2013-08-28 DIAGNOSIS — Z0289 Encounter for other administrative examinations: Secondary | ICD-10-CM

## 2013-09-08 ENCOUNTER — Encounter: Payer: Self-pay | Admitting: Cardiology

## 2013-10-12 ENCOUNTER — Telehealth: Payer: Self-pay | Admitting: Cardiology

## 2013-10-12 NOTE — Telephone Encounter (Signed)
New Message:  Pt's daughter is calling to see when the first time and last time was that her mom was see by Dr. Anne FuSkains. Pt has no history in Epic.. States these appts would've been w/ [facility].... I'm forwarding msg to Amy Stegall - Varanasi's MA, since she has access to [facility] records. Even though she is not Management consultantkain's nurse.

## 2013-10-13 NOTE — Telephone Encounter (Signed)
lmtrc

## 2013-10-13 NOTE — Telephone Encounter (Signed)
Pt was first seen as a new pt by Dr. Anne FuSkains on 09/15/12 and the last time pt was seen was 10/23/12. Pt is due to see Dr. Anne FuSkains 5/15.

## 2013-10-22 ENCOUNTER — Ambulatory Visit: Payer: BC Managed Care – PPO | Admitting: Cardiology

## 2013-10-22 NOTE — Telephone Encounter (Signed)
To Kenyatta in case pts daughter calls back.

## 2013-10-26 ENCOUNTER — Ambulatory Visit: Payer: BC Managed Care – PPO | Admitting: Cardiology

## 2013-11-13 NOTE — Telephone Encounter (Signed)
Left message if still needs information to call back

## 2013-11-16 ENCOUNTER — Encounter: Payer: Self-pay | Admitting: Cardiology

## 2013-11-16 ENCOUNTER — Encounter: Payer: Self-pay | Admitting: *Deleted

## 2013-11-16 DIAGNOSIS — I10 Essential (primary) hypertension: Secondary | ICD-10-CM | POA: Insufficient documentation

## 2013-11-18 ENCOUNTER — Ambulatory Visit (INDEPENDENT_AMBULATORY_CARE_PROVIDER_SITE_OTHER): Payer: BC Managed Care – PPO | Admitting: Cardiology

## 2013-11-18 ENCOUNTER — Encounter: Payer: Self-pay | Admitting: Cardiology

## 2013-11-18 VITALS — BP 138/100 | HR 77 | Ht 60.0 in | Wt 138.0 lb

## 2013-11-18 DIAGNOSIS — M79603 Pain in arm, unspecified: Secondary | ICD-10-CM

## 2013-11-18 DIAGNOSIS — I10 Essential (primary) hypertension: Secondary | ICD-10-CM

## 2013-11-18 DIAGNOSIS — M542 Cervicalgia: Secondary | ICD-10-CM

## 2013-11-18 DIAGNOSIS — M79609 Pain in unspecified limb: Secondary | ICD-10-CM

## 2013-11-18 DIAGNOSIS — I77810 Thoracic aortic ectasia: Secondary | ICD-10-CM

## 2013-11-18 NOTE — Patient Instructions (Addendum)
Your physician has requested that you have an echocardiogram. Echocardiography is a painless test that uses sound waves to create images of your heart. It provides your doctor with information about the size and shape of your heart and how well your heart's chambers and valves are working. This procedure takes approximately one hour. There are no restrictions for this procedure.  Your physician wants you to follow-up in: 1 YEAR WITH DR. Anne Fu You will receive a reminder letter in the mail two months in advance. If you don't receive a letter, please call our office to schedule the follow-up appointment.

## 2013-11-18 NOTE — Progress Notes (Signed)
[address] [place], [place]  [address] Phone: [phone] Fax:  [phone]  Date:  11/18/2013   ID:  Kara BeltonSheila Thornton, DOB [birth year], MRN [id number]  PCP:  No primary provider on file.   History of Present Illness: Kara Thornton is a 63 y.o. female with previous uncontrolled hypertension. At previous visit I increased her lisinopril/HCTZ to 20/25 mg. I also added Toprol-XL 50 mg once a day to her regimen. Her daughter previously showed me several blood pressure readings all of which are excellent at home. Her pulse does fluctuate at times but overall is within normal range.   An EKG was personally viewed from 08/24/12 which showed sinus rhythm, nonspecific ST-T wave changes with subtle scooping of lead to as well as lateral precordial leads. Hemoglobin 12.6, creatinine 0.8, troponin normal, chest x-ray showed no acute finding. I reviewed her primary physician's note. Multiple issues were addressed at that clinic visit, hypertension, stress at work, memory loss, ankle pain, left-sided weakness as well as "heart problem". She stated that she was seeing a cardiologist approximately 6 years ago and was on a medication but when she moved she did not establish with another cardiologist. At that time she had denied chest pain or shortness of breath. She was also diagnosed with sleep apnea several years ago but did not followup.   She had nuclear stress test that was low risk, reassuring with no evidence of ischemia. She had mild aortic root dilatation on echocardiogram 4.1 cm which will be followed on an annual basis. She also had carotid Doppler which showed minimal plaque but thyroid nodule. She saw a Careers advisersurgeon and was told that no further workup was needed for thyroid nodule according to her words.  Her main complaints now are neck pain, bilateral under the arm discomfort. Likely musculoskeletal.    Wt Readings from Last 3 Encounters:  11/18/13 138 lb (62.596 kg)  08/19/13  226 lb (102.513 kg)  04/21/13 216 lb (97.977 kg)     Past Medical History  Diagnosis Date  . Hypertension   . Headache(784.0)   . Heart disease, unspecified   . Memory loss   . Depression     Past Surgical History  Procedure Laterality Date  . Gallbladder surgery  2004    Current Outpatient Prescriptions  Medication Sig Dispense Refill  . celecoxib (CELEBREX) 200 MG capsule Take 200 mg by mouth as needed for pain.      . Cholecalciferol (D3-1000) 1000 UNITS capsule Take 1,000 Units by mouth daily.      Marland Kitchen. donepezil (ARICEPT) 10 MG tablet Take 1 tablet (10 mg total) by mouth at bedtime.  30 tablet  12  . DULoxetine (CYMBALTA) 60 MG capsule       . hydrochlorothiazide (HYDRODIURIL) 25 MG tablet       . metoprolol succinate (TOPROL-XL) 50 MG 24 hr tablet Take 50 mg by mouth daily. Take with or immediately following a meal.      . ranitidine (ZANTAC) 150 MG tablet Take 150 mg by mouth 2 (two) times daily.      . verapamil (COVERA HS) 240 MG (CO) 24 hr tablet Take 240 mg by mouth at bedtime.      . vitamin B-12 (CYANOCOBALAMIN) 1000 MCG tablet Take 1,000 mcg by mouth daily.       No current facility-administered medications for this visit.    Allergies:   No Known Allergies  Social History:  The patient  reports  that she has never smoked. She has never used smokeless tobacco. She reports that she does not drink alcohol or use illicit drugs.   Family History  Problem Relation Age of Onset  . Alzheimer's disease Mother   . Other Father     infection after heart surgery  . Other      blood clots  . Aneurysm    . Other      valve problems    ROS:  Please see the history of present illness.   Denies any fevers, chills, orthopnea, PND  All other systems reviewed and negative.   PHYSICAL EXAM: VS:  BP 138/100  Pulse 77  Ht 5' (1.524 m)  Wt 138 lb (62.596 kg)  BMI 26.95 kg/m2 Well nourished, well developed, in no acute distress HEENT: normal, [place]/AT, EOMI Neck: no JVD,  normal carotid upstroke, no bruit Cardiac:  normal S1, S2; RRR; no murmur Lungs:  clear to auscultation bilaterally, no wheezing, rhonchi or rales Abd: soft, nontender, no hepatomegaly, no bruitsObesity Ext: no edema, 2+ distal pulses Skin: warm and dry GU: deferred Neuro: no focal abnormalities noted, AAO x 3  EKG:  11/18/13 sinus rhythm, nonspecific ST-T wave changes, heart rate 77  ECHO: 09/24/12  1. There is mild concentric left ventricle hypertrophy. 2. Left ventricular ejection fraction estimated by 2D at 65-70 percent. 3. There were no regional wall motion abnormalities. 4. Mild calcification of the aortic valve. 5. Mild aortic valve regurgitation. 6. Trace of pulmonic valve regurgitation. 7. There is mild aortic root dilatation. 4.1 cm at the ascending root. 8. Analysis of mitral valve inflow, pulmonary vein Doppler and tissue Doppler suggests grade I diastolic dysfunction without elevated left atrial pressure.  NUC: 4/14 - low risk no ischemia  Carotid Doppler: 4/14 - reassuring, thyroid nodule noted (surgery appt made)   .  ASSESSMENT AND PLAN:  1. Hypertension - Dr. Cecille Aver has been working on this with her. May consider additional agent such as losartan. Her caregiver states that her blood pressure has been better at home. It is elevated today. 2. Dilated aortic root-4.1 cm ascending aorta previously. We will check echocardiogram. Blood pressure control is important to help prevent worsening expansion of aorta. Normal value is 3.7 cm. 3. Morbid obesity-continue to encourage weight loss. Low carbohydrate diet.  Signed, Donato Schultz, MD [facility]  11/18/2013 4:07 PM

## 2013-11-23 ENCOUNTER — Other Ambulatory Visit: Payer: Self-pay | Admitting: Diagnostic Neuroimaging

## 2013-12-14 ENCOUNTER — Other Ambulatory Visit (HOSPITAL_COMMUNITY): Payer: BC Managed Care – PPO

## 2013-12-15 ENCOUNTER — Encounter (HOSPITAL_COMMUNITY): Payer: Self-pay | Admitting: Cardiology

## 2014-02-19 ENCOUNTER — Ambulatory Visit: Payer: BC Managed Care – PPO | Admitting: Nurse Practitioner

## 2014-02-23 ENCOUNTER — Encounter (INDEPENDENT_AMBULATORY_CARE_PROVIDER_SITE_OTHER): Payer: Self-pay

## 2014-02-23 ENCOUNTER — Encounter: Payer: Self-pay | Admitting: Nurse Practitioner

## 2014-02-23 ENCOUNTER — Ambulatory Visit: Payer: BC Managed Care – PPO | Admitting: Nurse Practitioner

## 2014-02-23 ENCOUNTER — Ambulatory Visit (INDEPENDENT_AMBULATORY_CARE_PROVIDER_SITE_OTHER): Payer: BC Managed Care – PPO | Admitting: Nurse Practitioner

## 2014-02-23 VITALS — BP 139/82 | HR 65 | Ht 60.0 in | Wt 221.2 lb

## 2014-02-23 DIAGNOSIS — F329 Major depressive disorder, single episode, unspecified: Secondary | ICD-10-CM

## 2014-02-23 DIAGNOSIS — F3289 Other specified depressive episodes: Secondary | ICD-10-CM

## 2014-02-23 DIAGNOSIS — F32A Depression, unspecified: Secondary | ICD-10-CM

## 2014-02-23 DIAGNOSIS — R413 Other amnesia: Secondary | ICD-10-CM

## 2014-02-23 MED ORDER — DONEPEZIL HCL 10 MG PO TABS: 10.0000 mg | ORAL_TABLET | Freq: Every day | ORAL | Status: DC

## 2014-02-23 MED ORDER — MEMANTINE HCL ER 28 MG PO CP24: 1.0000 | ORAL_CAPSULE | Freq: Every day | ORAL | Status: DC

## 2014-02-23 NOTE — Progress Notes (Signed)
I reviewed note and agree with plan.   Bird Swetz R. Harve Spradley, MD 02/23/2014, 3:39 PM Certified in Neurology, Neurophysiology and Neuroimaging  [facility] [address], [address] [place], [place] [address] [phone]  

## 2014-02-23 NOTE — Patient Instructions (Addendum)
I think overall you are doing fairly well but I do want to suggest a few things today:  Continue seeing Psychiatry for depression.  Remember to drink plenty of fluid, eat healthy meals and do not skip any meals. Try to eat protein with a every meal and eat a healthy snack such as fruit or nuts in between meals. Try to keep a regular sleep-wake schedule and try to exercise daily, particularly in the form of walking, 20-30 minutes a day, if you can. Good nutrition, proper sleep and exercise can help her cognitive function.  Engage in social activities in your community and with your family and try to keep up with current events by reading the newspaper or watching the news. If you have computer and can go online, try [employer]. Also, you may like to do word finding puzzles or crossword puzzles.  As far as your medications are concerned, I would like to suggest to continue Donepezil and Namenda  XR 28 mg.   As far as diagnostic testing: none indicated at this time.  We would like to see you back in 6 months, sooner if we need to. Please call us with any interim questions, concerns, problems, updates or refill requests.  Please also call us for any test results so we can go over those with you on the phone. Brett Canales is my clinical assistant and will answer any of your questions and relay your messages to me and also relay most of my messages to you.  Our phone number is [phone]. We also have an after hours call service for urgent matters and there is a physician on-call for urgent questions. For any emergencies you know to call 911 or go to the nearest emergency room.

## 2014-02-23 NOTE — Progress Notes (Signed)
PATIENT: Kara Thornton DOB: 1951/03/01  REASON FOR VISIT: routine follow up for memory loss HISTORY FROM: patient, son-in-law  HISTORY OF PRESENT ILLNESS: UPDATE 02/23/14 (LL): Kara Thornton returns for follow up of memory problems.  She is accompanied by her son-in-law today. She continues to live with her daughter and son-in-law. Son-in-law states that her functional status has been stable. She continues to see psychiatry and her mood has been better. Before she started seing psychiatry and going on Cymbalta, she was having a feeling that someone was behind her breathing on her neck and she was too afraid to stay at the home at night by herself. This feeling has not reoccurred. Still complains of widespread back pain, neck pain. Son-in-law states she has good days and bad days with both her mood and her memory. Today is a good day. She scores 28/30 on her MMSE today. Clock drawing is 4/4. Animal fluency test is 9 animals in 1 minute.   Functional Assessment:  Activities of Daily Living (ADLs):  She is independent in the following: ambulation, bathing and hygiene, feeding, continence, grooming, toileting and dressing Requires assistance with the following: none  Instrumental Activities of Daily Living (IADLs):  Patient is independent in the following: operating telephone, housekeeping, laundry, drives own car short distances.  Requires assistance with the following: Medication administration, finances, meal preparation, shopping  UPDATE 08/19/13 (LL): Since last visit, has been stable, seeing Psychiatrist and depression seems better. Still easily confused, short term memory very poor. Pain is much improved since cortisone shot. Diffuse pain improved. Repeat MRI brain W/Wo unchanged from previous.  UPDATE 04/21/13(VP): Since last visit, depression better, but memory still poor. Patient having trouble with recent conversations, tasks, getting confused easily. Also with diffuse joint pain, mainly  in right hip, s/p cortisone injection.  UPDATE 02/26/13 (LL): Patient returns for followup after initial visit on 09/22/2012. Since that visit testing showed borderline low vitamin B12 level, she was started on by mouth supplementation; MRI brain showing nonspecific white matter changes likely small vessel ischemic disease, normal TSH, and neuropsychological testing which indicated mild impairments of attention, cognitive flexibility and organizational skills. Memory retention, naming to confrontation, verbal fluency, visual spatial organization, constructional praxis and conceptual reasoning skills were within normal limits. On the Beck depression inventory and anxiety inventories her scores both fell in the severe range for depression and anxiety. She was started by family medicine on an SSRI and then switched to Prozac 20 mg daily. Granddaughter notes that her pitch take his symptoms have improved, but cognitive function has not. She continues to have many somatic pain complaints.  PRIOR HPI (09/22/12, VRP): 63 year old female with hypertension, here for evaluation of memory loss.  Since August 2013, patient has had progressive short-term memory problems. 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 Patient had difficult time compensating for her change in routine. She was needing more assistance from other teachers. Around the same time she was having more stress overall. She was having 2 right more notes, having more problems with short-term memory loss, forgetting where she placed objects. Over the past 3 weeks, she and her daughter decided it would be better for patient to live with daughter here in [place]. Patient is also been getting other medical evaluation and testing.  Patient has a diffuse constellation of symptoms including migraine headaches, neck pain, pain in her elbows, pain in her lower leg. She has  some numbness and  tingling as well. Patient also having intermittent tearing of her eyes. Patient not able to comment whether crying related to depression and emotions or uncontrolled lacrimation from other cause.   REVIEW OF SYSTEMS: Full 14 system review of systems performed and notable only for eye itching, eye redness, and chest pain, murmur, heat intolerance, flushing, abdominal pain, restless legs, apnea, acting out creams, back pain, walking difficulty, neck pain, neck stiffness, confusion, depression anxiety, hallucinations, memory loss.  ALLERGIES: Allergies  Allergen Reactions  . Deoxyglucose   . Lisinopril     HOME MEDICATIONS: Outpatient Prescriptions Prior to Visit  Medication Sig Dispense Refill  . celecoxib (CELEBREX) 200 MG capsule Take 200 mg by mouth as needed for pain.      . Cholecalciferol (D3-1000) 1000 UNITS capsule Take 1,000 Units by mouth daily.      . DULoxetine (CYMBALTA) 60 MG capsule       . hydrochlorothiazide (HYDRODIURIL) 25 MG tablet       . metoprolol succinate (TOPROL-XL) 50 MG 24 hr tablet Take 50 mg by mouth daily. Take with or immediately following a meal.      . ranitidine (ZANTAC) 150 MG tablet Take 150 mg by mouth 2 (two) times daily.      . verapamil (COVERA HS) 240 MG (CO) 24 hr tablet Take 240 mg by mouth at bedtime.      . donepezil (ARICEPT) 10 MG tablet TAKE ONE TABLET BY MOUTH AT BEDTIME  30 tablet  0  . vitamin B-12 (CYANOCOBALAMIN) 1000 MCG tablet Take 1,000 mcg by mouth daily.       No facility-administered medications prior to visit.    PHYSICAL EXAM Filed Vitals:   02/23/14 1052  BP: 139/82  Pulse: 65  Height: 5' (1.524 m)  Weight: 221 lb 3.2 oz (100.336 kg)   Body mass index is 43.2 kg/(m^2). No exam data present No flowsheet data found.  MMSE - Mini Mental State Exam 02/23/2014  Orientation to time 5  Orientation to Place 3  Registration 3  Attention/ Calculation 5  Recall 3  Language- name 2 objects 2  Language- repeat 1  Language-  follow 3 step command 3  Language- read & follow direction 1  Write a sentence 1  Copy design 1  Total score 28   Generalized: Well developed, obese AA female in no acute distress  Head: normocephalic and atraumatic. Oropharynx benign  Neck: Supple, no carotid bruits  Cardiac: Regular rate rhythm, 2/6 systolic murmur  Musculoskeletal: No deformity   Neurological examination  MENTAL STATUS: awake, alert, language fluent, comprehension intact, naming intact; NO FRONTAL RELEASE SIGNS.  CRANIAL NERVE:  pupils equal and reactive to light, visual fields full to confrontation, extraocular muscles NOTABLE FOR EXOTROPIA, WITH SUSTAINED NYSTAGMUS IN ALL DIRECTIONS (WORSE IN LEFT GAZE), facial sensation and strength symmetric, uvula midline, shoulder shrug symmetric, tongue midline.  MOTOR: normal bulk and tone, full strength in the BUE, BLE  SENSORY: normal and symmetric to light touch, pinprick, temperature, vibration  COORDINATION: finger-nose-finger, fine finger movements, heel-shin normal  REFLEXES: deep tendon reflexes present and symmetric  GAIT/STATION: narrow based gait; ANTALGIC, SLOW, UNSTEADY. CANNOT TANDEM.   DIAGNOSTIC DATA (LABS, IMAGING, TESTING) - I reviewed patient records, labs, notes, testing and imaging myself where available.  Lab Results  Component Value Date   VITAMINB12 467 02/26/2013   Lab Results  Component Value Date   TSH 1.620 09/23/2012   06/03/13 MRI brain W/Wo: Equivocal MRI brain (with  and without) demonstrating scattered non-specific foci of gliosis in the subcortical and periventricular white matter, likely mild chronic small vessel ischemic disease. No change from MRI on 10/23/12. Also noted is CSF pulsation artifact in the ventricles.   ASSESSMENT: 63 y.o. female with Hypertension; Headache; and Heart disease, here with progressive short-term memory loss, confusion (MMSE 26 --> 29 --> 23 --> 22 -->28), depression and chronic pain.   Ddx: pseudo-dementia of  depression, mild dementia, pain associated cognitive decline   PLAN:  1. Continue Donepezil and Namenda  XR 28 mg.  2. Continue seeing Psychiatry for depression.  3. Follow up in 6 months with Dr. Marjory Lies, sooner as needed.  Meds ordered this encounter  Medications  . donepezil (ARICEPT) 10 MG tablet    Sig: Take 1 tablet (10 mg total) by mouth at bedtime.    Dispense:  90 tablet    Refill:  3    Order Specific Question:  Supervising Provider    Answer:  Joycelyn Schmid R [[id number]]  . Memantine HCl ER (NAMENDA XR) 28 MG CP24    Sig: Take 28 mg by mouth daily.    Dispense:  30 capsule    Refill:  11    Order Specific Question:  Supervising Provider    Answer:  Suanne Marker [[id number]]   Return in about 6 months (around 08/24/2014) for memory loss.  Tawny Asal Egypt Marchiano, MSN, FNP-BC, A/GNP-C 02/23/2014, 1:14 PM [facility] [address], [address] [place], [place] [address] [phone]  Note: This document was prepared with digital dictation and possible smart phrase technology. Any transcriptional errors that result from this process are unintentional.

## 2014-03-20 IMAGING — MG MM DIGITAL SCREENING BILAT
5 series · 5 of 5 positions shown · non-contrast
Comparison: None

***ADDENDUM*** CREATED: 09/12/2012 [DATE]

The report from an outside prior exam dated 01/07/2004 is reviewed,
but the images are corrupted and cannot be retrieved from the
provided CD. I am asked to perform a final interpretation without
repeating a request for the outside prior exam.
CLINICAL DATA: Screening.
DIGITAL BILATERAL SCREENING MAMMOGRAM WITH CAD

[R CC]
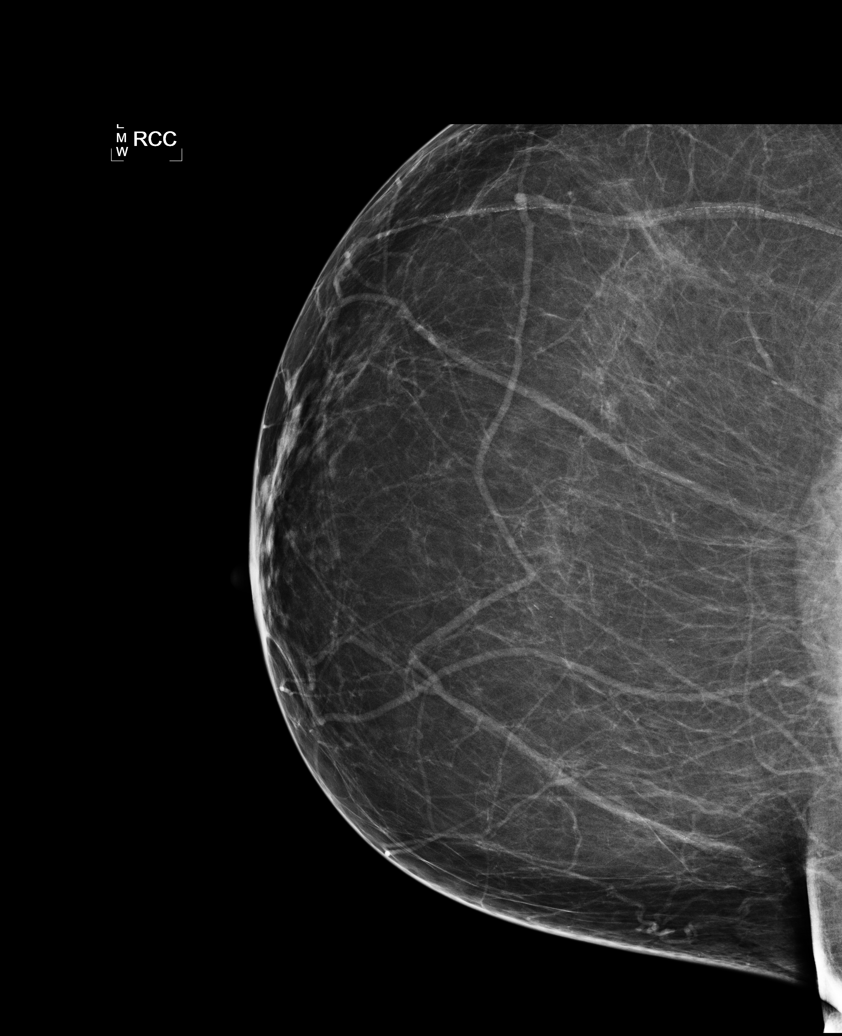

[L CC]
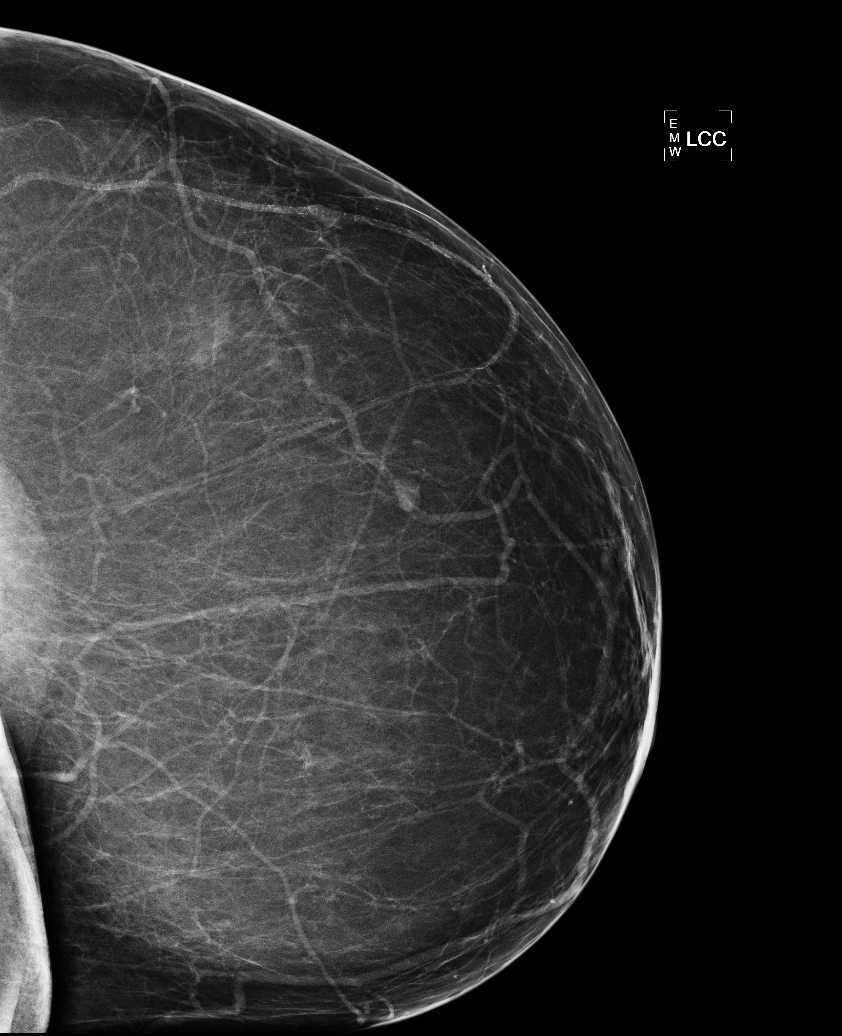

[L MLO]
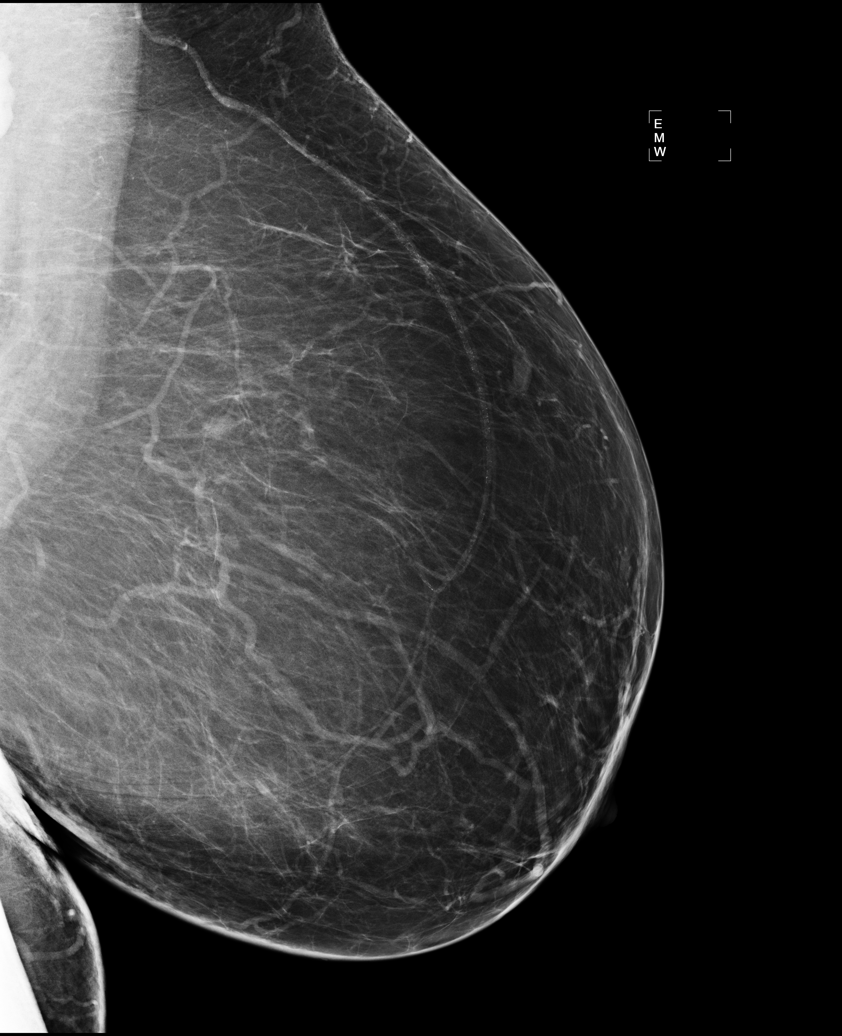

[R MLO]
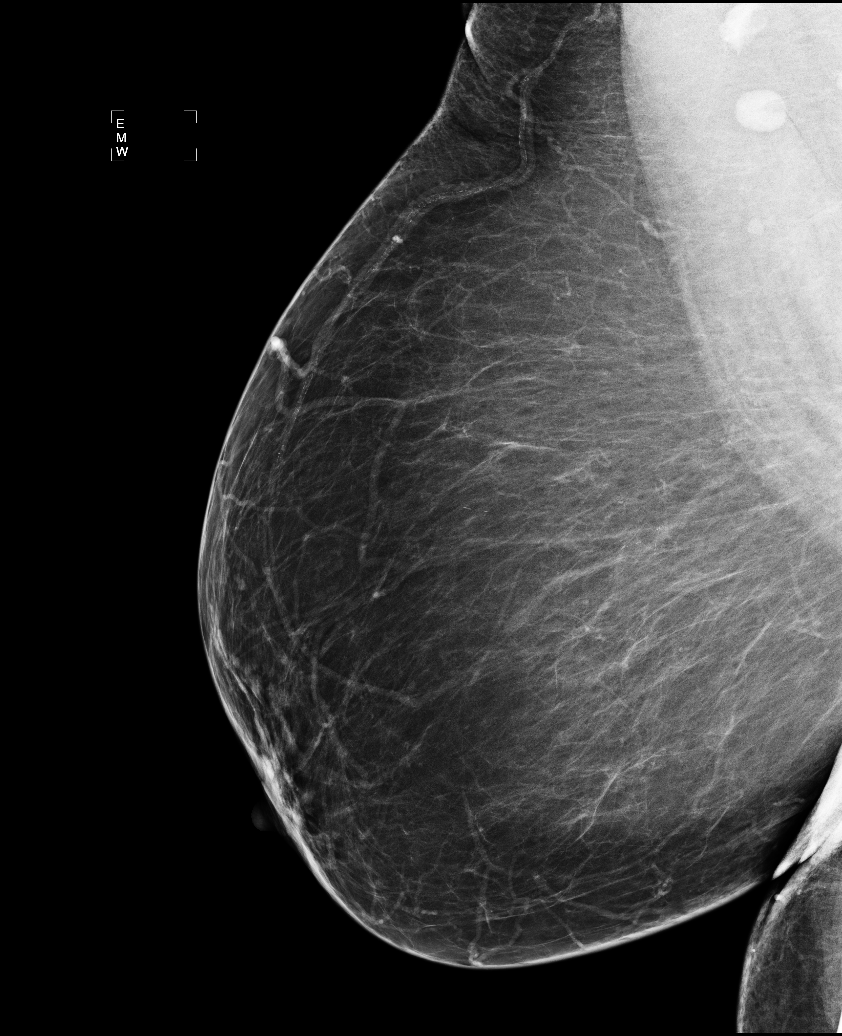

[L CV]
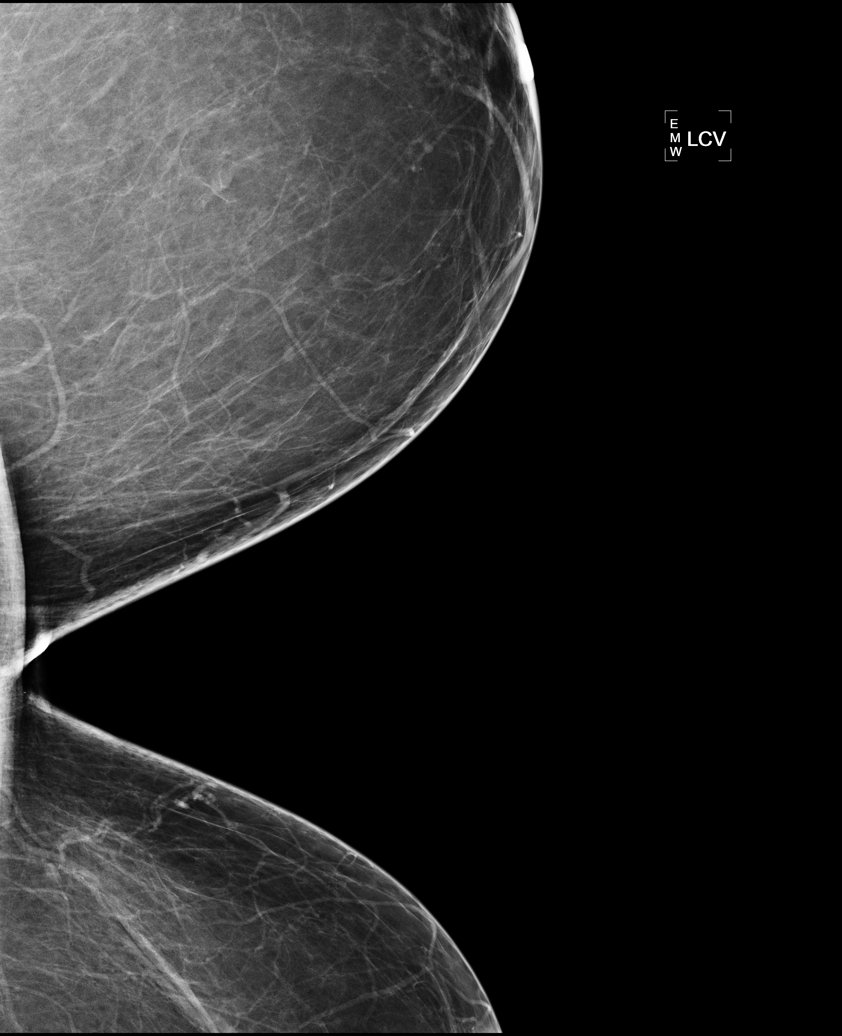

[5 of 5 positions shown; findings below may reference images not displayed]

FINDINGS: ACR Breast Density Category 1: The breast tissue is almost entirely
fatty.

No suspicious masses, architectural distortion, or calcifications
are present.

Images were processed with CAD.
IMPRESSION: No mammographic evidence of malignancy.

A result letter of this screening mammogram will be mailed directly
to the patient.

RECOMMENDATION:
Screening mammogram in one year. (Code:G7-M-H75)

BI-RADS CATEGORY 1:  Negative.
FINDINGS: Prior films are needed for interpretation.
IMPRESSION: Prior exams will be requested for comparison.  Following comparison
with prior studies an addendum will be made regarding further
recommendations.

RECOMMENDATION:
Obtain prior studies for comparison. (Code:LK-H-QQ5)

BI-RADS CATEGORY 0:  Incomplete.  Need additional imaging
evaluation and/or prior mammograms for comparison.

## 2014-03-30 ENCOUNTER — Ambulatory Visit: Payer: BC Managed Care – PPO | Admitting: Nurse Practitioner

## 2014-05-28 DIAGNOSIS — Z0289 Encounter for other administrative examinations: Secondary | ICD-10-CM

## 2014-08-24 ENCOUNTER — Encounter: Payer: Self-pay | Admitting: Diagnostic Neuroimaging

## 2014-08-24 ENCOUNTER — Ambulatory Visit (INDEPENDENT_AMBULATORY_CARE_PROVIDER_SITE_OTHER): Payer: Medicare Other | Admitting: Diagnostic Neuroimaging

## 2014-08-24 VITALS — BP 123/79 | HR 62 | Ht 60.0 in | Wt 244.6 lb

## 2014-08-24 DIAGNOSIS — F419 Anxiety disorder, unspecified: Secondary | ICD-10-CM | POA: Diagnosis not present

## 2014-08-24 DIAGNOSIS — R413 Other amnesia: Secondary | ICD-10-CM | POA: Diagnosis not present

## 2014-08-24 MED ORDER — DONEPEZIL HCL 10 MG PO TABS: 10.0000 mg | ORAL_TABLET | Freq: Every day | ORAL | Status: AC

## 2014-08-24 MED ORDER — MEMANTINE HCL ER 28 MG PO CP24: 28.0000 mg | ORAL_CAPSULE | Freq: Every day | ORAL | Status: AC

## 2014-08-24 NOTE — Patient Instructions (Signed)
Continue current medications. 

## 2014-08-24 NOTE — Progress Notes (Signed)
[facility]  PATIENT: Kara Thornton DOB: 08/01/1950   REASON FOR VISIT: follow up HISTORY FROM: patient and son-in-law  Chief Complaint  Patient presents with  . Follow-up    memory loss     HISTORY OF PRESENT ILLNESS:  UPDATE 08/24/14: Since last visit, memory and mood are improved. Patient still living at home with daughter and son-in-law. Taking namenda and donepezil.   UPDATE 02/23/14 (LL): Ms. Kara Thornton returns for follow up of memory problems. She is accompanied by her son-in-law today. She continues to live with her daughter and son-in-law. Son-in-law states that her functional status has been stable. She continues to see psychiatry and her mood has been better. Before she started seing psychiatry and going on Cymbalta, she was having a feeling that someone was behind her breathing on her neck and she was too afraid to stay at the home at night by herself. This feeling has not reoccurred. Still complains of widespread back pain, neck pain. Son-in-law states she has good days and bad days with both her mood and her memory. Today is a good day. She scores 28/30 on her MMSE today. Clock drawing is 4/4. Animal fluency test is 9 animals in 1 minute.   UPDATE 08/19/13 (LL): Since last visit, has been stable, seeing Psychiatrist and depression seems better. Still easily confused, short term memory very poor. Pain is much improved since cortisone shot. Diffuse pain improved. Repeat MRI brain W/Wo unchanged from previous.  UPDATE 04/21/13 (VRP): Since last visit, depression better, but memory still poor. Patient having trouble with recent conversations, tasks, getting confused easily. Also with diffuse joint pain, mainly in right hip, s/p cortisone injection.  UPDATE 02/26/13 (LL): Patient returns for followup after initial visit on 09/22/2012. Since that visit testing showed borderline low vitamin B12 level, she was started on by mouth supplementation; MRI brain showing  nonspecific white matter changes likely small vessel ischemic disease, normal TSH, and neuropsychological testing which indicated mild impairments of attention, cognitive flexibility and organizational skills. Memory retention, naming to confrontation, verbal fluency, visual spatial organization, constructional praxis and conceptual reasoning skills were within normal limits. On the Beck depression inventory and anxiety inventories her scores both fell in the severe range for depression and anxiety. She was started by family medicine on an SSRI and then switched to Prozac 20 mg daily. Granddaughter notes that her pitch take his symptoms have improved, but cognitive function has not.  She continues to have many somatic pain complaints.  PRIOR HPI (09/22/12, VRP):  64 year old female with hypertension, here for evaluation of memory loss. Since August 2013, patient has had progressive short-term memory problems. Patient previously living on her own in [place], [place], working as a Runner, broadcasting/film/videoteacher. 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 Patient had difficult time compensating for her change in routine. She was needing more assistance from other teachers. Around the same time she was having more stress overall. She was having 2 right more notes, having more problems with short-term memory loss, forgetting where she placed objects. Over the past 3 weeks, she and her daughter decided it would be better for patient to live with daughter here in [place]. Patient is also been getting other medical evaluation and testing. Patient has a diffuse constellation of symptoms including migraine headaches, neck pain, pain in her elbows, pain in her lower leg. She has some numbness and tingling as well. Patient also having intermittent tearing of her eyes. Patient not able to comment whether crying  related to depression and emotions or uncontrolled lacrimation from other cause.    REVIEW OF SYSTEMS: Full 14 system review of systems  performed and notable only for memory loss.  ALLERGIES: Allergies  Allergen Reactions  . Deoxyglucose   . Lisinopril     HOME MEDICATIONS: Outpatient Prescriptions Prior to Visit  Medication Sig Dispense Refill  . celecoxib (CELEBREX) 200 MG capsule Take 200 mg by mouth as needed for pain.    . Cholecalciferol (D3-1000) 1000 UNITS capsule Take 1,000 Units by mouth daily.    . ranitidine (ZANTAC) 150 MG tablet Take 150 mg by mouth 2 (two) times daily.    . verapamil (COVERA HS) 240 MG (CO) 24 hr tablet Take 240 mg by mouth at bedtime.    . donepezil (ARICEPT) 10 MG tablet Take 1 tablet (10 mg total) by mouth at bedtime. 90 tablet 3  . Cyanocobalamin (B-12) 100 MCG TABS Take by mouth daily.    . DULoxetine (CYMBALTA) 60 MG capsule     . hydrochlorothiazide (HYDRODIURIL) 25 MG tablet     . Memantine HCl ER (NAMENDA XR) 28 MG CP24 Take 28 mg by mouth daily. 30 capsule 11  . metoprolol succinate (TOPROL-XL) 50 MG 24 hr tablet Take 50 mg by mouth daily. Take with or immediately following a meal.     No facility-administered medications prior to visit.     PAST MEDICAL HISTORY: Past Medical History  Diagnosis Date  . Hypertension   . Headache(784.0)   . Heart disease, unspecified   . Memory loss   . Depression   . Dilated aortic root     PAST SURGICAL HISTORY: Past Surgical History  Procedure Laterality Date  . Gallbladder surgery  2004    FAMILY HISTORY: Family History  Problem Relation Age of Onset  . Alzheimer's disease Mother   . Other Father     infection after heart surgery  . Other      blood clots  . Aneurysm    . Other      valve problems    SOCIAL HISTORY: History   Social History  . Marital Status: Legally Separated    Spouse Name: N/A  . Number of Children: 3  . Years of Education: MA   Occupational History  . Teacher     [place]  .     Social History Main Topics  . Smoking status: Never Smoker   . Smokeless tobacco: Never Used    . Alcohol Use: No  . Drug Use: No  . Sexual Activity: Not on file   Other Topics Concern  . Not on file   Social History Narrative   Pt lives at home alone.    Caffeine Use-  Quit drinking 3-4 cups daily on 08/25/12.     PHYSICAL EXAM  Filed Vitals:   08/24/14 1119  BP: 123/79  Pulse: 62  Height: 5' (1.524 m)  Weight: 244 lb 9.6 oz (110.95 kg)   Body mass index is 47.77 kg/(m^2).  MMSE - Mini Mental State Exam 08/24/2014 02/23/2014  Orientation to time 5 5  Orientation to Place 4 3  Registration 3 3  Attention/ Calculation 5 5  Recall 3 3  Language- name 2 objects 2 2  Language- repeat 1 1  Language- follow 3 step command 3 3  Language- read & follow direction 1 1  Write a sentence 1 1  Copy design 1 1  Total score 29 28   GENERAL EXAM: Patient  is in no distress  CARDIOVASCULAR: Regular rate and rhythm, no murmurs, no carotid bruits  NEUROLOGIC: MENTAL STATUS: awake, alert, language fluent, comprehension intact, naming intact; MMSE 29/30. NO FRONTAL RELEASE SIGNS. GOOD MOOD/SPIRITS. JOVIAL. CRANIAL NERVE: no papilledema on fundoscopic exam, pupils equal and reactive to light, visual fields full to confrontation, extraocular muscles NOTABLE FOR EXOTROPIA, WITH SUSTAINED NYSTAGMUS IN ALL DIRECTIONS (WORSE IN LEFT GAZE), facial sensation and strength symmetric, uvula midline, shoulder shrug symmetric, tongue midline. MOTOR: normal bulk and tone, full strength in the BUE, BLE SENSORY: normal and symmetric to light touch, temperature, vibration COORDINATION: finger-nose-finger, fine finger movements normal REFLEXES: deep tendon reflexes present and symmetric GAIT/STATION: narrow based gait; ANTALGIC, SLOW, UNSTEADY. CANNOT TANDEM.    DIAGNOSTIC DATA (LABS, IMAGING, TESTING) - I reviewed patient records, labs, notes, testing and imaging myself where available.  Lab Results  Component Value Date   VITAMINB12 467 02/26/2013   Lab Results  Component Value Date    TSH 1.620 09/23/2012   I reviewed images myself and agree with interpretation. -VRP  10/23/12  MRI brain (without) - scattered non-specific foci of gliosis in the subcortical and periventricular white matter, likely mild chronic small vessel ischemic disease.  06/04/13 Equivocal MRI brain (with and without) - scattered non-specific foci of gliosis in the subcortical and periventricular white matter, likely mild chronic small vessel ischemic disease. No change from MRI on 10/23/12. Also noted is CSF pulsation artifact in the ventricles.     ASSESSMENT AND PLAN 64 y.o. female with Hypertension; Headache; and Heart disease, here with progressive short-term memory loss. Memory has improved since mood has improved.   MMSE  09/22/12 - 26 02/26/13 - 29 04/21/13 - 23  08/19/13 - 22 02/23/14 - 28 08/24/14 - 29  Ddx: pseudo-dementia of depression vs pain associated cognitive decline; mild dementia less likely  PLAN: 1. Continue donepezil and namenda (refilled)  Meds ordered this encounter  Medications  . memantine (NAMENDA XR) 28 MG CP24 24 hr capsule    Sig: Take 1 capsule (28 mg total) by mouth daily.    Dispense:  90 capsule    Refill:  4  . donepezil (ARICEPT) 10 MG tablet    Sig: Take 1 tablet (10 mg total) by mouth at bedtime.    Dispense:  90 tablet    Refill:  4   Return in about 6 months (around 02/24/2015).    Suanne Marker, MD 08/24/2014, 5:27 PM Certified in Neurology, Neurophysiology and Neuroimaging  [facility] [address], [address] [place], [place] [address] [phone]

## 2014-12-28 ENCOUNTER — Telehealth: Payer: Self-pay | Admitting: Diagnostic Neuroimaging

## 2014-12-28 ENCOUNTER — Encounter: Payer: Self-pay | Admitting: *Deleted

## 2014-12-28 NOTE — Telephone Encounter (Signed)
Patients daughter called and requested to have Dr. Marjory LiesPenumalli fax her a letter stating that the patient has a disability. Please call and advise.

## 2014-12-28 NOTE — Telephone Encounter (Signed)
Thanks. I signed letter. -VRP

## 2014-12-28 NOTE — Telephone Encounter (Addendum)
Returned call from daughter who states she received a letter from her mother's home mortgage company requesting a physician letter stating Ms [name] has a permanent disability. Daughter states the letter specifies that the dr does not need to provide details of disability. She requests Dr Marjory LiesPenumalli write letter and it be faxed directly to her. Informed her this RN will have letter printed, signed and faxed to her no later than close of this office tomorrow. She verbalized understanding, appreciation. Fax [phone]  12/28/14 1:33 pm Successful fax of letter to above number as requested.

## 2015-03-01 ENCOUNTER — Telehealth: Payer: Self-pay | Admitting: Diagnostic Neuroimaging

## 2015-03-01 ENCOUNTER — Ambulatory Visit: Payer: Medicare Other | Admitting: Diagnostic Neuroimaging

## 2015-03-01 NOTE — Telephone Encounter (Signed)
[facility] in [place] called and would like an authorization on medication memantine (NAMENDA XR) 28 MG CP24 24 hr capsule, please call them as they have a question regarding this as well . [phone]

## 2015-03-01 NOTE — Telephone Encounter (Signed)
I called back.  Spoke with Thayer Ohm.  He said the patient has transferred the Namenda XR to at least 4 different pharmacies, and wanted to let us know she was transferring it there as well.  They will fill the original Rx that has refills remaining, this ws just an [place].  As well, they will ask the patient to contact our office regarding appt, as it appears she was supposed to be seen this week.

## 2015-03-02 ENCOUNTER — Encounter: Payer: Self-pay | Admitting: Diagnostic Neuroimaging
